# Patient Record
Sex: Male | Born: 1955
Health system: Southern US, Community
[De-identification: ages and names within clinical notes are randomized; demographics above are authoritative.]

---

## 2006-08-21 ENCOUNTER — Emergency Department (HOSPITAL_COMMUNITY): Admission: EM | Admit: 2006-08-21 | Discharge: 2006-08-21 | Payer: Self-pay | Admitting: Family Medicine

## 2006-08-23 ENCOUNTER — Emergency Department (HOSPITAL_COMMUNITY): Admission: EM | Admit: 2006-08-23 | Discharge: 2006-08-23 | Payer: Self-pay | Admitting: Emergency Medicine

## 2006-10-08 ENCOUNTER — Emergency Department (HOSPITAL_COMMUNITY): Admission: EM | Admit: 2006-10-08 | Discharge: 2006-10-08 | Payer: Self-pay | Admitting: Emergency Medicine

## 2013-07-27 ENCOUNTER — Emergency Department (HOSPITAL_COMMUNITY)
Admission: EM | Admit: 2013-07-27 | Discharge: 2013-07-27 | Disposition: A | Discharge status: Home / Self Care | Payer: Self-pay | Attending: Emergency Medicine | Admitting: Emergency Medicine

## 2013-07-27 ENCOUNTER — Encounter (HOSPITAL_COMMUNITY): Payer: Self-pay | Admitting: Emergency Medicine

## 2013-07-27 ENCOUNTER — Emergency Department (HOSPITAL_COMMUNITY): Payer: Self-pay

## 2013-07-27 DIAGNOSIS — Z79899 Other long term (current) drug therapy: Secondary | ICD-10-CM | POA: Insufficient documentation

## 2013-07-27 DIAGNOSIS — M109 Gout, unspecified: Secondary | ICD-10-CM

## 2013-07-27 LAB — CBC WITH DIFFERENTIAL/PLATELET
Basophils Absolute: 0 10*3/uL (ref 0.0–0.1)
Basophils Relative: 0 % (ref 0–1)
Eosinophils Absolute: 0.3 10*3/uL (ref 0.0–0.7)
Eosinophils Relative: 4 % (ref 0–5)
HCT: 40.2 % (ref 39.0–52.0)
MCH: 29.2 pg (ref 26.0–34.0)
MCHC: 34.6 g/dL (ref 30.0–36.0)
MCV: 84.5 fL (ref 78.0–100.0)
Monocytes Absolute: 0.5 10*3/uL (ref 0.1–1.0)
RDW: 14.1 % (ref 11.5–15.5)

## 2013-07-27 LAB — POCT I-STAT, CHEM 8
Calcium, Ion: 1.18 mmol/L (ref 1.12–1.23)
Chloride: 105 mEq/L (ref 96–112)
Glucose, Bld: 108 mg/dL — ABNORMAL HIGH (ref 70–99)
HCT: 42 % (ref 39.0–52.0)
Hemoglobin: 14.3 g/dL (ref 13.0–17.0)

## 2013-07-27 MED ORDER — PREDNISONE 20 MG PO TABS: 40.0000 mg | ORAL_TABLET | Freq: Every day | ORAL | Status: AC

## 2013-07-27 MED ORDER — PREDNISONE 20 MG PO TABS
40.0000 mg | ORAL_TABLET | Freq: Once | ORAL | Status: AC
  Administered 2013-07-27: 40 mg via ORAL
  Filled 2013-07-27: qty 2

## 2013-07-27 MED ORDER — HYDROCODONE-ACETAMINOPHEN 5-325 MG PO TABS: 1.0000 | ORAL_TABLET | Freq: Four times a day (QID) | ORAL | Status: DC | PRN

## 2013-07-27 MED ORDER — HYDROCODONE-ACETAMINOPHEN 5-325 MG PO TABS
1.0000 | ORAL_TABLET | Freq: Once | ORAL | Status: AC
  Administered 2013-07-27: 1 via ORAL
  Filled 2013-07-27: qty 1

## 2013-07-27 NOTE — ED Provider Notes (Signed)
  CSN: [id number]     Arrival date & time 07/27/13  0146 History     First MD Initiated Contact with Patient 07/27/13 0422     Chief Complaint  Patient presents with  . Hand Pain   (Consider location/radiation/quality/duration/timing/severity/associated sxs/prior Treatment) Patient is a 57 y.o. male presenting with hand pain.  Hand Pain    History reviewed. No pertinent past medical history. History reviewed. No pertinent past surgical history. No family history on file. History  Substance Use Topics  . Smoking status: Never Smoker   . Smokeless tobacco: Not on file  . Alcohol Use: Yes    Review of Systems  Allergies  Review of patient's allergies indicates no known allergies.  Home Medications   Current Outpatient Rx  Name  Route  Sig  Dispense  Refill  . HYDROcodone-acetaminophen (VICODIN) 5-500 MG per tablet   Oral   Take 2 tablets by mouth once.         Marland Kitchen ibuprofen (ADVIL,MOTRIN) 200 MG tablet   Oral   Take 400 mg by mouth once.         Marland Kitchen HYDROcodone-acetaminophen (NORCO/VICODIN) 5-325 MG per tablet   Oral   Take 1 tablet by mouth every 6 (six) hours as needed for pain.   30 tablet   0   . predniSONE (DELTASONE) 20 MG tablet   Oral   Take 2 tablets (40 mg total) by mouth daily.   10 tablet   0    BP 149/93  Pulse 92  Temp(Src) 98.9 F (37.2 C) (Oral)  Resp 14  SpO2 97% Physical Exam  ED Course   Procedures (including critical care time)  Labs Reviewed  URIC ACID - Abnormal; Notable for the following:    Uric Acid, Serum 9.8 (*)    All other components within normal limits  POCT I-STAT, CHEM 8 - Abnormal; Notable for the following:    Glucose, Bld 108 (*)    All other components within normal limits  CBC WITH DIFFERENTIAL   Dg Hand Complete Right  07/27/2013   *RADIOLOGY REPORT*  Clinical Data: Right hand pain for 2 days.  No injury.  RIGHT HAND - COMPLETE 3+ VIEW  Comparison: None.  Findings: Anatomic alignment bones of the right  hand.  There is no fracture identified.  No radiopaque foreign body.  Mild second MCP joint osteoarthritis is present.  Index finger predominant DIP joint osteoarthritis is present with large marginal osteophytes and overlying soft tissue swelling compatible with Heberden nodes.  IMPRESSION: No acute osseous abnormality.  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   Original Report Authenticated By: Andreas Newport, M.D.   1. Gout attack     MDM  Most likely gout  + uric acid   Arman Filter, NP 07/27/13 2015

## 2013-07-27 NOTE — ED Notes (Signed)
Pt comfortable with d/c and f/u instructions. Prescriptions x2. 

## 2013-07-27 NOTE — ED Notes (Signed)
PT. REPORTS PROGRESSING RIGHT HAND PAIN / FINGERS STIFFNESS FOR SEVERAL DAYS WORSE THIS EVENING , DENIE INJURY.

## 2013-07-28 NOTE — ED Provider Notes (Signed)
Medical screening examination/treatment/procedure(s) were performed by non-physician practitioner and as supervising physician I was immediately available for consultation/collaboration.  Lexine Jaspers, MD 07/28/13 0443 

## 2014-02-04 ENCOUNTER — Emergency Department (HOSPITAL_COMMUNITY)
Admission: EM | Admit: 2014-02-04 | Discharge: 2014-02-05 | Disposition: A | Discharge status: Home / Self Care | Payer: PRIVATE HEALTH INSURANCE | Attending: Emergency Medicine | Admitting: Emergency Medicine

## 2014-02-04 ENCOUNTER — Encounter (HOSPITAL_COMMUNITY): Payer: Self-pay | Admitting: Emergency Medicine

## 2014-02-04 DIAGNOSIS — F1020 Alcohol dependence, uncomplicated: Secondary | ICD-10-CM | POA: Insufficient documentation

## 2014-02-04 LAB — URINALYSIS, ROUTINE W REFLEX MICROSCOPIC
Bilirubin Urine: NEGATIVE
GLUCOSE, UA: NEGATIVE mg/dL
Hgb urine dipstick: NEGATIVE
KETONES UR: NEGATIVE mg/dL
LEUKOCYTES UA: NEGATIVE
NITRITE: NEGATIVE
PH: 7 (ref 5.0–8.0)
Protein, ur: NEGATIVE mg/dL
SPECIFIC GRAVITY, URINE: 1.018 (ref 1.005–1.030)
Urobilinogen, UA: 0.2 mg/dL (ref 0.0–1.0)

## 2014-02-04 LAB — CBC WITH DIFFERENTIAL/PLATELET
Basophils Absolute: 0 10*3/uL (ref 0.0–0.1)
Basophils Relative: 0 % (ref 0–1)
EOS ABS: 0.2 10*3/uL (ref 0.0–0.7)
EOS PCT: 4 % (ref 0–5)
HCT: 38.4 % — ABNORMAL LOW (ref 39.0–52.0)
HEMOGLOBIN: 12.8 g/dL — AB (ref 13.0–17.0)
LYMPHS ABS: 1 10*3/uL (ref 0.7–4.0)
LYMPHS PCT: 18 % (ref 12–46)
MCH: 28.1 pg (ref 26.0–34.0)
MCHC: 33.3 g/dL (ref 30.0–36.0)
MCV: 84.2 fL (ref 78.0–100.0)
MONOS PCT: 8 % (ref 3–12)
Monocytes Absolute: 0.4 10*3/uL (ref 0.1–1.0)
NEUTROS PCT: 69 % (ref 43–77)
Neutro Abs: 3.7 10*3/uL (ref 1.7–7.7)
PLATELETS: 210 10*3/uL (ref 150–400)
RBC: 4.56 MIL/uL (ref 4.22–5.81)
RDW: 14.3 % (ref 11.5–15.5)
WBC: 5.3 10*3/uL (ref 4.0–10.5)

## 2014-02-04 LAB — COMPREHENSIVE METABOLIC PANEL
ALK PHOS: 68 U/L (ref 39–117)
ALT: 18 U/L (ref 0–53)
AST: 25 U/L (ref 0–37)
Albumin: 3.7 g/dL (ref 3.5–5.2)
BUN: 13 mg/dL (ref 6–23)
CO2: 27 meq/L (ref 19–32)
Calcium: 8.7 mg/dL (ref 8.4–10.5)
Chloride: 101 mEq/L (ref 96–112)
Creatinine, Ser: 1.02 mg/dL (ref 0.50–1.35)
GFR, EST NON AFRICAN AMERICAN: 80 mL/min — AB (ref >90)
GLUCOSE: 105 mg/dL — AB (ref 70–99)
POTASSIUM: 4.5 meq/L (ref 3.7–5.3)
SODIUM: 138 meq/L (ref 137–147)
TOTAL PROTEIN: 7.8 g/dL (ref 6.0–8.3)
Total Bilirubin: 0.4 mg/dL (ref 0.3–1.2)

## 2014-02-04 LAB — RAPID URINE DRUG SCREEN, HOSP PERFORMED
Amphetamines: NOT DETECTED
Barbiturates: NOT DETECTED
Benzodiazepines: NOT DETECTED
Cocaine: NOT DETECTED
OPIATES: NOT DETECTED
Tetrahydrocannabinol: NOT DETECTED

## 2014-02-04 LAB — ETHANOL

## 2014-02-04 MED ORDER — LORAZEPAM 1 MG PO TABS: 1.0000 mg | ORAL_TABLET | Freq: Three times a day (TID) | ORAL | Status: DC | PRN

## 2014-02-04 MED ORDER — IBUPROFEN 200 MG PO TABS: 600.0000 mg | ORAL_TABLET | Freq: Three times a day (TID) | ORAL | Status: DC | PRN

## 2014-02-04 MED ORDER — ONDANSETRON HCL 4 MG PO TABS
4.0000 mg | ORAL_TABLET | Freq: Three times a day (TID) | ORAL | Status: DC | PRN
Start: 2014-02-04 — End: 2014-02-05

## 2014-02-04 MED ORDER — ACETAMINOPHEN 325 MG PO TABS
650.0000 mg | ORAL_TABLET | ORAL | Status: DC | PRN
Start: 2014-02-04 — End: 2014-02-05

## 2014-02-04 MED ORDER — ALUM & MAG HYDROXIDE-SIMETH 200-200-20 MG/5ML PO SUSP
30.0000 mL | ORAL | Status: DC | PRN
Start: 2014-02-04 — End: 2014-02-05

## 2014-02-04 MED ORDER — NICOTINE 21 MG/24HR TD PT24: 21.0000 mg | MEDICATED_PATCH | Freq: Every day | TRANSDERMAL | Status: DC

## 2014-02-04 MED ORDER — ZOLPIDEM TARTRATE 5 MG PO TABS: 5.0000 mg | ORAL_TABLET | Freq: Every evening | ORAL | Status: DC | PRN

## 2014-02-04 NOTE — ED Provider Notes (Signed)
CSN: [id number]     Arrival date & time 02/04/14  1225 History   First MD Initiated Contact with Patient 02/04/14 1240     Chief Complaint  Patient presents with  . Medical Clearance   (Consider location/radiation/quality/duration/timing/severity/associated sxs/prior Treatment) HPI Comments: Patient is a 58 year old male with history of chronic alcoholism. He presents today requesting inpatient treatment. He states that he drinks 1 pint of hard liquor per day along with with unspecified quantities of beer and wine. He last drank yesterday evening. He denies any other complaints.  The history is provided by the patient.    History reviewed. No pertinent past medical history. History reviewed. No pertinent past surgical history. History reviewed. No pertinent family history. History  Substance Use Topics  . Smoking status: Never Smoker   . Smokeless tobacco: Not on file  . Alcohol Use: Yes    Review of Systems  All other systems reviewed and are negative.    Allergies  Review of patient's allergies indicates no known allergies.  Home Medications   Current Outpatient Rx  Name  Route  Sig  Dispense  Refill  . HYDROcodone-acetaminophen (NORCO/VICODIN) 5-325 MG per tablet   Oral   Take 1 tablet by mouth every 6 (six) hours as needed for pain.   30 tablet   0   . HYDROcodone-acetaminophen (VICODIN) 5-500 MG per tablet   Oral   Take 2 tablets by mouth once.         Marland Kitchen. ibuprofen (ADVIL,MOTRIN) 200 MG tablet   Oral   Take 400 mg by mouth once.          BP 153/88  Pulse 85  Temp(Src) 98.1 F (36.7 C) (Oral)  Resp 18  SpO2 100% Physical Exam  Nursing note and vitals reviewed. Constitutional: He is oriented to person, place, and time. He appears well-developed and well-nourished. No distress.  HENT:  Head: Normocephalic and atraumatic.  Mouth/Throat: Oropharynx is clear and moist.  Neck: Normal range of motion. Neck supple.  Cardiovascular: Normal rate, regular  rhythm and normal heart sounds.   No murmur heard. Pulmonary/Chest: Effort normal and breath sounds normal. No respiratory distress. He has no wheezes.  Abdominal: Soft. Bowel sounds are normal. He exhibits no distension. There is no tenderness.  Musculoskeletal: Normal range of motion. He exhibits no edema.  Neurological: He is alert and oriented to person, place, and time.  Skin: Skin is warm and dry. He is not diaphoretic.    ED Course  Procedures (including critical care time) Labs Review Labs Reviewed  CBC WITH DIFFERENTIAL  COMPREHENSIVE METABOLIC PANEL  URINE RAPID DRUG SCREEN (HOSP PERFORMED)  ETHANOL  URINALYSIS, ROUTINE W REFLEX MICROSCOPIC   Imaging Review No results found.    MDM  No diagnosis found. Patient is a 58 year old male who presents requesting alcohol treatment. Laboratory studies are unremarkable and he appears to be medically cleared. He will be moved to the psych holding unit awaiting consultation with [facility]. He is not homicidal or suicidal and is voluntary at this time.    Geoffery Lyonsouglas Carmello Cabiness, MD 02/04/14 [phone]

## 2014-02-04 NOTE — ED Notes (Signed)
Patient sat on the chair in his room at the beginning of this shift. He appeared very irritable. He reported feeling uncomfortable because the temperature in his room was too cold. Also requested for his shoes that was already locked up in the locker.  Writer adjusted room temperature. He denied SI/Hi and denied hallucinations. Q 15 minute check continues as ordered to maintain safety.

## 2014-02-04 NOTE — ED Notes (Signed)
Pt here for detox from alcohol.  Pt denies SI/HI.  Pt states had a gap in drinking and started back again recently.  Wants inpatient

## 2014-02-04 NOTE — Progress Notes (Signed)
[facility] provided pt with a list of primary care resources and [facility] information. Patient stated that he was pending insurance on 3/1st.

## 2014-02-04 NOTE — ED Notes (Addendum)
Patient states he is here on the recommendation of treatment centers to "detox" from etoh.  States he drank " 1 pint" last night.  Denies other drug use. Also reports upcoming DUI court date.

## 2014-02-04 NOTE — Progress Notes (Signed)
[facility] who stated that they did have male detox beds but they were short a nurse and probably wouldn't have a disposition for us until morning.  Spoke with Joni ReiningNicole at [facility] who stated that  They also had male detox beds available and to fax over information with referral form

## 2014-02-04 NOTE — ED Notes (Signed)
Pt found in hallway.  MD notified.

## 2014-02-04 NOTE — ED Notes (Signed)
Patient in his room. Sitting on his chair and watching TV. Mood and affect continues to be blunted and irritable.He denied SI/HI and denied hallucinations. He refused HS medication. Writer encouraged and supported patient. Q 15 minute check continues as ordered to maintain safety.

## 2014-02-04 NOTE — ED Notes (Signed)
Patient's v/s elevated earlier. MD notified, he encouraged staff to recheck it v/s manually. Writer manually rechecked v/s, it was 144/86. Notified MD again. He said that's  Alright. No treatment at this time per physician.

## 2014-02-04 NOTE — ED Notes (Signed)
MD at bedside. 

## 2014-02-04 NOTE — Progress Notes (Signed)
   CARE MANAGEMENT ED NOTE 02/04/2014  Patient:  Earl LagosJOYNER,Kenyata   Account Number:  [id number]  Date Initiated:  02/04/2014  Documentation initiated by:  Radford PaxFERRERO,Marcus Groll  Subjective/Objective Assessment:   Patient presents to Ed with wanting help with alcohol abuse.     Subjective/Objective Assessment Detail:   Patient without significant pmhx     Action/Plan:   Action/Plan Detail:   Anticipated DC Date:       Status Recommendation to Physician:   Result of Recommendation:    Other ED Services  Consult Working Plan    DC Planning Services  Other  PCP issues    Choice offered to / List presented to:            Status of service:  Completed, signed off  ED Comments:   ED Comments Detail:  Cristy FriedlanderStacy Wright  Signed  Progress Notes Service date: 02/04/2014 3:54 PM [facility] provided pt with a list of primary care resources and [facility] information. Patient stated that he was pending insurance on 3/1st

## 2014-02-04 NOTE — ED Notes (Signed)
Went to find patient.  Pt not in location.  Notified staff to be on lookout.

## 2014-02-04 NOTE — ED Notes (Signed)
Pt in bathroom undressing.

## 2014-02-04 NOTE — BH Assessment (Signed)
Assessment Note  Robert Johns is an 58 y.o. male. Patient presents seeking help with detox and rehabilitation services. Patient states that he has been attempting to detox on his own to avoid coming to the ED and accruing bills that he can not afford to pay. He states that he is still in litigation over a  DUI he received 2 years ago. He states that he felt his drinking getting out of hand and attempted wind it back in before hit started affecting his job but was unable to do so. His has been unable to get his life back together. He has been unemployed for the past year and trying his best not to become homeless. Theses issues are causing him to have increased anxiety,stress and decreased focus. Patient states that he tried to us the tools he obtained 8 years ago while going through treatment with [facility], he even attempted to contact facilities on his own to get some sort of help with treatment but everything has failed for him and he feels that this is his last option.   Patient states that his longest period of sobriety has been 1 month. His has a court date on 02/15/14 for a DUI and 02/16/14 for an open container. States that he drinks approximately a pint a day. He last drank yesterday. Denies SI, HI, AVH.      Axis I: Alcohol Dependence Axis II: Deferred Axis III: History reviewed. No pertinent past medical history. Axis IV: economic problems, housing problems, occupational problems, other psychosocial or environmental problems, problems related to legal system/crime, problems related to social environment and problems with primary support group Axis V: 40  Past Medical History: History reviewed. No pertinent past medical history.  History reviewed. No pertinent past surgical history.  Family History: History reviewed. No pertinent family history.  Social History:  reports that he has never smoked. He does not have any smokeless tobacco history on file. He reports that he  drinks alcohol. He reports that he does not use illicit drugs.  Additional Social History:  Alcohol / Drug Use Pain Medications: Na Prescriptions: Na Over the Counter: NA History of alcohol / drug use?: Yes Longest period of sobriety (when/how long): 1 month Negative Consequences of Use: Financial;Legal;Work / School Withdrawal Symptoms: Patient aware of relationship between substance abuse and physical/medical complications Substance #1 Name of Substance 1: Beer, Wine , Liquor 1 - Age of First Use: 16 1 - Amount (size/oz): 1 pint 1 - Frequency:  Daily 1 - Duration: 1 year 1 - Last Use / Amount: last night/ 1 pint  CIWA: CIWA-Ar BP: 153/88 mmHg Pulse Rate: 85 COWS:    Allergies: No Known Allergies  Home Medications:  (Not in a hospital admission)  OB/GYN Status:  No LMP for male patient.  General Assessment Data Location of Assessment: [facility] Assessment Services Is this a Tele or Face-to-Face Assessment?: Face-to-Face Is this an Initial Assessment or a Re-assessment for this encounter?: Initial Assessment Living Arrangements: Non-relatives/Friends Can pt return to current living arrangement?: Yes Admission Status: Voluntary Is patient capable of signing voluntary admission?: Yes Transfer from: Home Referral Source: MD  Medical Screening Exam [facility] Walk-in ONLY) Medical Exam completed: Yes  [facility] Living Arrangements: Non-relatives/Friends Name of Psychiatrist: one  Name of Therapist: None  Education Status Is patient currently in school?: No Current Grade: Na Highest grade of school patient has completed: 12th [place] [facility] [employer] [employer] Contact person: Teacher, English as a foreign languageylester Petrucci/ cousin  Risk to self  Suicidal Ideation: No Suicidal Intent: No Is patient at risk for suicide?: No Suicidal Plan?: No Access to Means: No Specify Access to Suicidal Means: Na What has been your use of drugs/alcohol within the last 12 months?: Daily Previous  Attempts/Gestures: No How many times?:  (Na) Other Self Harm Risks: None Triggers for Past Attempts: None known Intentional Self Injurious Behavior: None Family Suicide History: No Recent stressful life event(s): Job Loss;Financial Problems Persecutory voices/beliefs?: No Depression: No Depression Symptoms:  (Attentive, Anxious) Substance abuse history and/or treatment for substance abuse?: Yes Suicide prevention information given to non-admitted patients: Not applicable  Risk to Others Homicidal Ideation: No Thoughts of Harm to Others: No Current Homicidal Intent: No Current Homicidal Plan: No Access to Homicidal Means: No Identified Victim: Na History of harm to others?: No Assessment of Violence: None Noted Violent Behavior Description: a Does patient have access to weapons?: No Criminal Charges Pending?: Yes Describe Pending Criminal Charges: DUI/ Open container Does patient have a court date: Yes Court Date:  (02/15/14 & 02/16/14)  Psychosis Hallucinations: None noted Delusions: None noted  Mental Status Report Appear/Hygiene:  (WNL) Eye Contact: Good Motor Activity: Freedom of movement;Unremarkable Speech: Logical/coherent Level of Consciousness: Alert Mood: Anxious;Preoccupied Affect: Appropriate to circumstance;Anxious Anxiety Level: Minimal Thought Processes: Coherent;Relevant Judgement: Unimpaired Orientation: Person;Place;Time;Situation Obsessive Compulsive Thoughts/Behaviors: None  Cognitive Functioning Concentration: Normal Memory: Recent Intact;Remote Intact IQ: Average Insight: Good Impulse Control: Good Appetite: Fair Weight Loss:  (None noted) Weight Gain:  (None  noted) Sleep: No Change Total Hours of Sleep:  (7 hours) Vegetative Symptoms: None  ADLScreening [facility] Assessment Services) Patient's cognitive ability adequate to safely complete daily activities?: Yes Patient able to express need for assistance with ADLs?: Yes Independently  performs ADLs?: Yes (appropriate for developmental age)  Prior Inpatient Therapy Prior Inpatient Therapy: Yes Prior Therapy Dates: 8 years ago Prior Therapy Facilty/Provider(s): [facility]/ [facility] Reason for Treatment: SA  Prior Outpatient Therapy Prior Outpatient Therapy: No Prior Therapy Dates: Na Prior Therapy Facilty/Provider(s): Na Reason for Treatment: Na  ADL Screening (condition at time of admission) Patient's cognitive ability adequate to safely complete daily activities?: Yes Is the patient deaf or have difficulty hearing?: No Does the patient have difficulty seeing, even when wearing glasses/contacts?: No Does the patient have difficulty concentrating, remembering, or making decisions?: No Patient able to express need for assistance with ADLs?: Yes Does the patient have difficulty dressing or bathing?: No Independently performs ADLs?: Yes (appropriate for developmental age) Does the patient have difficulty walking or climbing stairs?: No Weakness of Legs: None Weakness of Arms/Hands: None  Home Assistive Devices/Equipment Home Assistive Devices/Equipment: None  Therapy Consults (therapy consults require a physician order) PT Evaluation Needed: No OT Evalulation Needed: No SLP Evaluation Needed: No Abuse/Neglect Assessment (Assessment to be complete while patient is alone) Physical Abuse: Denies Verbal Abuse: Denies Sexual Abuse: Denies Exploitation of patient/patient's resources: Denies Self-Neglect: Denies Values / Beliefs Cultural Requests During Hospitalization: None Spiritual Requests During Hospitalization: None Consults Spiritual Care Consult Needed: No Social Work Consult Needed: No Merchant navy officer (For Healthcare) Advance Directive: Patient does not have advance directive;Patient would not like information Pre-existing out of facility DNR order (yellow form or pink MOST form): No    Additional Information 1:1 In Past 12 Months?: No CIRT  Risk: No Elopement Risk: No Does patient have medical clearance?: Yes     Disposition:  Disposition Initial Assessment Completed for this Encounter: Yes Disposition of Patient: Other dispositions;Referred to Other disposition(s): Referred to outside facility (  [facility]) Patient referred to: [facility]  On Site Evaluation by:   Reviewed with Physician:    Rudi Coco 02/04/2014 4:54 PM

## 2014-02-05 ENCOUNTER — Inpatient Hospital Stay (HOSPITAL_COMMUNITY): Admission: AD | Admit: 2014-02-05 | Payer: Self-pay | Source: Intra-hospital | Admitting: Psychiatry

## 2014-02-05 DIAGNOSIS — F1020 Alcohol dependence, uncomplicated: Secondary | ICD-10-CM

## 2014-02-05 MED ORDER — HYDROXYZINE HCL 25 MG PO TABS: 25.0000 mg | ORAL_TABLET | Freq: Four times a day (QID) | ORAL | Status: DC | PRN

## 2014-02-05 MED ORDER — CHLORDIAZEPOXIDE HCL 25 MG PO CAPS: 25.0000 mg | ORAL_CAPSULE | Freq: Four times a day (QID) | ORAL | Status: DC

## 2014-02-05 MED ORDER — CHLORDIAZEPOXIDE HCL 25 MG PO CAPS: 25.0000 mg | ORAL_CAPSULE | Freq: Every day | ORAL | Status: DC

## 2014-02-05 MED ORDER — ADULT MULTIVITAMIN W/MINERALS CH: 1.0000 | ORAL_TABLET | Freq: Every day | ORAL | Status: DC

## 2014-02-05 MED ORDER — THIAMINE HCL 100 MG/ML IJ SOLN: 100.0000 mg | Freq: Once | INTRAMUSCULAR | Status: DC

## 2014-02-05 MED ORDER — CHLORDIAZEPOXIDE HCL 25 MG PO CAPS: 50.0000 mg | ORAL_CAPSULE | Freq: Once | ORAL | Status: DC

## 2014-02-05 MED ORDER — VITAMIN B-1 100 MG PO TABS: 100.0000 mg | ORAL_TABLET | Freq: Every day | ORAL | Status: DC

## 2014-02-05 MED ORDER — CHLORDIAZEPOXIDE HCL 25 MG PO CAPS: 25.0000 mg | ORAL_CAPSULE | Freq: Three times a day (TID) | ORAL | Status: DC

## 2014-02-05 MED ORDER — CHLORDIAZEPOXIDE HCL 25 MG PO CAPS: 25.0000 mg | ORAL_CAPSULE | Freq: Four times a day (QID) | ORAL | Status: DC | PRN

## 2014-02-05 MED ORDER — ONDANSETRON 4 MG PO TBDP: 4.0000 mg | ORAL_TABLET | Freq: Four times a day (QID) | ORAL | Status: DC | PRN

## 2014-02-05 MED ORDER — LOPERAMIDE HCL 2 MG PO CAPS: 2.0000 mg | ORAL_CAPSULE | ORAL | Status: DC | PRN

## 2014-02-05 MED ORDER — CHLORDIAZEPOXIDE HCL 25 MG PO CAPS: 25.0000 mg | ORAL_CAPSULE | ORAL | Status: DC

## 2014-02-05 NOTE — ED Notes (Signed)
[facility] notified of transportation need 

## 2014-02-05 NOTE — ED Notes (Signed)
Resting comfortably with eyes closed

## 2014-02-05 NOTE — ED Notes (Signed)
[facility] Accepted to room [facility]1. Will [facility].

## 2014-02-05 NOTE — ED Notes (Signed)
Pt discharged to home.  Discharge instructions both verbal and written to pt with verbalization of understanding.  Discharge instructions to include follow up care.  All belongings in pts possession and signed for.  Dr. Lolly MustacheArfeen was able to talk to pt prior to discharge answering any questions or concerns.   Denies HI/SI, auditory or visual hallucinations on discharge.  Pt ready for discharge, with no further questions.  Pt escorted to lobby for discharge.

## 2014-02-05 NOTE — ED Notes (Signed)
Patient is resting comfortably. 

## 2014-02-05 NOTE — BH Assessment (Signed)
Alberteen SamFran Hobson, NP accepted Pt to the service of Dr. Beverly MilchGlenn Jennings, room 304-1. Notified RN of acceptance.  Harlin RainFord Ellis Ria CommentWarrick Jr, LPC, [facility] Triage Specialist

## 2014-02-05 NOTE — ED Notes (Signed)
Patient states last drink was 2 days ago and amount he drinks varies. Not specific about quantity.

## 2014-02-05 NOTE — ED Notes (Signed)
Notified by Dr.Arfeen that patient would be discharged home with resource materials for [facility] and [facility]. Notified Engineer, petroleumTroy [facility]

## 2014-02-05 NOTE — [facility] Suicide Risk Assessment (Cosign Needed)
Suicide Risk Assessment  Discharge Assessment     Demographic Factors:  Male, Low socioeconomic status and Unemployed  Total Time spent with patient: 15 minutes  Psychiatric Specialty Exam:     Blood pressure 135/77, pulse 74, temperature 97.4 F (36.3 C), temperature source Oral, resp. rate 19, SpO2 99.00%.There is no height or weight on file to calculate BMI.  General Appearance: Casual and Fairly Groomed  Patent attorneyye Contact::  Fair  Speech:  Clear and Coherent and Normal Rate  Volume:  Normal  Mood:  Depressed  Affect:  Depressed  Thought Process:  Coherent and Goal Directed  Orientation:  Full (Time, Place, and Person)  Thought Content:  NA  Suicidal Thoughts:  No  Homicidal Thoughts:  No  Memory:  Immediate;   Good Recent;   Good Remote;   Good  Judgement:  Fair  Insight:  Good  Psychomotor Activity:  Normal  Concentration:  Good  Recall:  NA  Fund of Knowledge:English is primary language  Language: Good  Akathisia:  NA  Handed:  Right  AIMS (if indicated):     Assets:  Desire for Improvement  Sleep:       Musculoskeletal: Strength & Muscle Tone: within normal limits Gait & Station: normal Patient leans: N/A   Mental Status Per Nursing Assessment::   On Admission:     Current Mental Status by Physician: NA  Loss Factors: Financial problems/change in socioeconomic status and lost job  Historical Factors: NA  Risk Reduction Factors:   Sense of responsibility to family, Positive coping skills or problem solving skills and Positive with his plan to clean himself and secure another job  Continued Clinical Symptoms:  Alcohol/Substance Abuse/Dependencies  Cognitive Features That Contribute To Risk:  Polarized thinking    Suicide Risk:  Minimal: No identifiable suicidal ideation.  Patients presenting with no risk factors but with morbid ruminations; may be classified as minimal risk based on the severity of the depressive symptoms  Discharge Diagnoses:    AXIS I:  Alcohol depend AXIS II:  Deferred AXIS III:  History reviewed. No pertinent past medical history. AXIS IV:  other psychosocial or environmental problems, problems related to legal system/crime, problems related to social environment and problems with primary support group AXIS V:  61-70 mild symptoms  Plan Of Care/Follow-up recommendations:  Activity:  as tolerated Diet:  regula  Is patient on multiple antipsychotic therapies at discharge:  No   Has Patient had three or more failed trials of antipsychotic monotherapy by history:  No  Recommended Plan for Multiple Antipsychotic Therapies: NA    Analuisa Tudor, C   PMHNP-BC 02/05/2014, 1:15 PM

## 2014-02-05 NOTE — Consult Note (Addendum)
[facility] Consult   Reason for Consult:  Alcohol Dependence Referring Physician:  EDP Robert Johns is an 58 y.o. male. Total Time spent with patient: 45 minutes  Assessment: AXIS I:  Alcohol dependenc,  AXIS II:  Deferred AXIS III:  History reviewed. No pertinent past medical history. AXIS IV:  other psychosocial or environmental problems and problems related to social environment AXIS V:  61-70 mild symptoms  Plan:  No evidence of imminent risk to self or others at present.    Subjective:   Robert Johns is a 58 y.o. male patient was evaluated for Alcohol dependence and withdrawal symptoms.  HPI:  Robert Johns is an 58 y.o. male. Patient presents seeking help with detox and rehabilitation services. He has been unable to get his life back together and  has been unemployed for the past year and trying his best not to become homeless. These issues are causing him to have increased anxiety,stress and decreased focus.  He is seeking treatment for his drinking so he can go back to work and get his life together.  Patient states his mood is depressed but that he has not been officially diagnosed with depression.  Patient also states he does not have out patient provider.  Patient declines treatment for his mood until he completes alcohol rehabilitation.  Patient believes his alcohol problem is related to his mood but will like to complete rehabilitation .treatment first. Patient states that his longest period of sobriety has been 1 month. His has a court date on 02/15/14 for a DUI and 02/16/14 for an open container. States that he drinks approximately a pint a day. He last drank yesterday. Denies SI, HI, AVH.  We have determined that patient is not a danger to himself and he promises to follow up with [facility], or [facility] for inpatient alcohol rehabilitation.  HPI Elements:   Location:  Alcohol problem. Quality:  It is becoming a problem, he lost his job and is about to lose his  hiouse. Severity:  Severe, lost his job and may lose his house, cannot get his life back due to alcoholism. Context:  Long time chronic drinking problems..  Past Psychiatric History: History reviewed. No pertinent past medical history.  reports that he has never smoked. He does not have any smokeless tobacco history on file. He reports that he drinks alcohol. He reports that he does not use illicit drugs. History reviewed. No pertinent family history. Family History Substance Abuse: Yes, Describe: (Daily) Family Supports: No Living Arrangements: Non-relatives/Friends Can pt return to current living arrangement?: Yes Abuse/Neglect [facility] Physical Abuse: Denies Verbal Abuse: Denies Sexual Abuse: Denies Allergies:  No Known Allergies  ACT Assessment Complete:  Yes:    Educational Status    Risk to Self: Risk to self Suicidal Ideation: No Suicidal Intent: No Is patient at risk for suicide?: No Suicidal Plan?: No Access to Means: No Specify Access to Suicidal Means: Na What has been your use of drugs/alcohol within the last 12 months?: Daily Previous Attempts/Gestures: No How many times?:  (Na) Other Self Harm Risks: None Triggers for Past Attempts: None known Intentional Self Injurious Behavior: None Family Suicide History: No Recent stressful life event(s): Job Loss;Financial Problems Persecutory voices/beliefs?: No Depression: No Depression Symptoms:  (Attentive, Anxious) Substance abuse history and/or treatment for substance abuse?: Yes (Pt reports ETOH abuse) Suicide prevention information given to non-admitted patients: Not applicable  Risk to Others: Risk to Others Homicidal Ideation: No Thoughts of Harm to Others: No Current Homicidal  Intent: No Current Homicidal Plan: No Access to Homicidal Means: No Identified Victim: Na History of harm to others?: No Assessment of Violence: None Noted Violent Behavior Description: a Does patient have access to weapons?:  No Criminal Charges Pending?: Yes Describe Pending Criminal Charges: DUI/ Open container Does patient have a court date: Yes Court Date:  (02/15/14 & 02/16/14)  Abuse: Abuse/Neglect Assessment (Assessment to be complete while patient is alone) Physical Abuse: Denies Verbal Abuse: Denies Sexual Abuse: Denies Exploitation of patient/patient's resources: Denies Self-Neglect: Denies  Prior Inpatient Therapy: Prior Inpatient Therapy Prior Inpatient Therapy: Yes Prior Therapy Dates: 8 years ago Prior Therapy Facilty/Provider(s): [facility]/ [facility] Reason for Treatment: SA  Prior Outpatient Therapy: Prior Outpatient Therapy Prior Outpatient Therapy: No Prior Therapy Dates: Na Prior Therapy Facilty/Provider(s): Na Reason for Treatment: Na  Additional Information: Additional Information 1:1 In Past 12 Months?: No CIRT Risk: No Elopement Risk: No Does patient have medical clearance?: Yes                  Objective: Blood pressure 135/77, pulse 74, temperature 97.4 F (36.3 C), temperature source Oral, resp. rate 19, SpO2 99.00%.There is no height or weight on file to calculate BMI. Results for orders placed during the hospital encounter of 02/04/14 (from the past 72 hour(s))  CBC WITH DIFFERENTIAL     Status: Abnormal   Collection Time    02/04/14  1:32 PM      Result Value Range   WBC 5.3  4.0 - 10.5 K/uL   RBC 4.56  4.22 - 5.81 MIL/uL   Hemoglobin 12.8 (*) 13.0 - 17.0 g/dL   HCT 38.4 (*) 39.0 - 52.0 %   MCV 84.2  78.0 - 100.0 fL   MCH 28.1  26.0 - 34.0 pg   MCHC 33.3  30.0 - 36.0 g/dL   RDW 14.3  11.5 - 15.5 %   Platelets 210  150 - 400 K/uL   Neutrophils Relative % 69  43 - 77 %   Neutro Abs 3.7  1.7 - 7.7 K/uL   Lymphocytes Relative 18  12 - 46 %   Lymphs Abs 1.0  0.7 - 4.0 K/uL   Monocytes Relative 8  3 - 12 %   Monocytes Absolute 0.4  0.1 - 1.0 K/uL   Eosinophils Relative 4  0 - 5 %   Eosinophils Absolute 0.2  0.0 - 0.7 K/uL   Basophils Relative 0   0 - 1 %   Basophils Absolute 0.0  0.0 - 0.1 K/uL  COMPREHENSIVE METABOLIC PANEL     Status: Abnormal   Collection Time    02/04/14  1:32 PM      Result Value Range   Sodium 138  137 - 147 mEq/L   Potassium 4.5  3.7 - 5.3 mEq/L   Chloride 101  96 - 112 mEq/L   CO2 27  19 - 32 mEq/L   Glucose, Bld 105 (*) 70 - 99 mg/dL   BUN 13  6 - 23 mg/dL   Creatinine, Ser 1.02  0.50 - 1.35 mg/dL   Calcium 8.7  8.4 - 10.5 mg/dL   Total Protein 7.8  6.0 - 8.3 g/dL   Albumin 3.7  3.5 - 5.2 g/dL   AST 25  0 - 37 U/L   ALT 18  0 - 53 U/L   Alkaline Phosphatase 68  39 - 117 U/L   Total Bilirubin 0.4  0.3 - 1.2 mg/dL   GFR calc  non Af Amer 80 (*) >90 mL/min   GFR calc Af Amer >90  >90 mL/min   Comment: (NOTE)     The eGFR has been calculated using the CKD EPI equation.     This calculation has not been validated in all clinical situations.     eGFR's persistently <90 mL/min signify possible Chronic Kidney     Disease.  ETHANOL     Status: None   Collection Time    02/04/14  1:32 PM      Result Value Range   Alcohol, Ethyl (B) <11  0 - 11 mg/dL   Comment:            LOWEST DETECTABLE LIMIT FOR     SERUM ALCOHOL IS 11 mg/dL     FOR MEDICAL PURPOSES ONLY  URINE RAPID DRUG SCREEN (HOSP PERFORMED)     Status: None   Collection Time    02/04/14  2:37 PM      Result Value Range   Opiates NONE DETECTED  NONE DETECTED   Cocaine NONE DETECTED  NONE DETECTED   Benzodiazepines NONE DETECTED  NONE DETECTED   Amphetamines NONE DETECTED  NONE DETECTED   Tetrahydrocannabinol NONE DETECTED  NONE DETECTED   Barbiturates NONE DETECTED  NONE DETECTED   Comment:            DRUG SCREEN FOR MEDICAL PURPOSES     ONLY.  IF CONFIRMATION IS NEEDED     FOR ANY PURPOSE, NOTIFY LAB     WITHIN 5 DAYS.                LOWEST DETECTABLE LIMITS     FOR URINE DRUG SCREEN     Drug Class       Cutoff (ng/mL)     Amphetamine      1000     Barbiturate      200     Benzodiazepine   076     Tricyclics       808      Opiates          300     Cocaine          300     THC              50  URINALYSIS, ROUTINE W REFLEX MICROSCOPIC     Status: None   Collection Time    02/04/14  2:37 PM      Result Value Range   Color, Urine YELLOW  YELLOW   APPearance CLEAR  CLEAR   Specific Gravity, Urine 1.018  1.005 - 1.030   pH 7.0  5.0 - 8.0   Glucose, UA NEGATIVE  NEGATIVE mg/dL   Hgb urine dipstick NEGATIVE  NEGATIVE   Bilirubin Urine NEGATIVE  NEGATIVE   Ketones, ur NEGATIVE  NEGATIVE mg/dL   Protein, ur NEGATIVE  NEGATIVE mg/dL   Urobilinogen, UA 0.2  0.0 - 1.0 mg/dL   Nitrite NEGATIVE  NEGATIVE   Leukocytes, UA NEGATIVE  NEGATIVE   Comment: MICROSCOPIC NOT DONE ON URINES WITH NEGATIVE PROTEIN, BLOOD, LEUKOCYTES, NITRITE, OR GLUCOSE <1000 mg/dL.   Labs are reviewed and are pertinent for Unremarkable.  Current Facility-Administered Medications  Medication Dose Route Frequency Provider Last Rate Last Dose  . acetaminophen (TYLENOL) tablet 650 mg  650 mg Oral Q4H PRN Leota Jacobsen, MD      . alum & mag hydroxide-simeth (MAALOX/MYLANTA) 200-200-20 MG/5ML suspension 30 mL  30 mL Oral PRN  Leota Jacobsen, MD      . chlordiazePOXIDE (LIBRIUM) capsule 25 mg  25 mg Oral Q6H PRN Lurena Nida, NP      . chlordiazePOXIDE (LIBRIUM) capsule 25 mg  25 mg Oral QID Lurena Nida, NP       Followed by  . [START ON 02/06/2014] chlordiazePOXIDE (LIBRIUM) capsule 25 mg  25 mg Oral TID Lurena Nida, NP       Followed by  . [START ON 02/07/2014] chlordiazePOXIDE (LIBRIUM) capsule 25 mg  25 mg Oral BH-qamhs Lurena Nida, NP       Followed by  . [START ON 02/08/2014] chlordiazePOXIDE (LIBRIUM) capsule 25 mg  25 mg Oral Daily Lurena Nida, NP      . chlordiazePOXIDE (LIBRIUM) capsule 50 mg  50 mg Oral Once Lurena Nida, NP      . hydrOXYzine (ATARAX/VISTARIL) tablet 25 mg  25 mg Oral Q6H PRN Lurena Nida, NP      . ibuprofen (ADVIL,MOTRIN) tablet 600 mg  600 mg Oral Q8H PRN Leota Jacobsen, MD      . loperamide (IMODIUM)  capsule 2-4 mg  2-4 mg Oral PRN Lurena Nida, NP      . multivitamin with minerals tablet 1 tablet  1 tablet Oral Daily Lurena Nida, NP      . nicotine (NICODERM CQ - dosed in mg/24 hours) patch 21 mg  21 mg Transdermal Daily Leota Jacobsen, MD      . ondansetron (ZOFRAN-ODT) disintegrating tablet 4 mg  4 mg Oral Q6H PRN Lurena Nida, NP      . thiamine (B-1) injection 100 mg  100 mg Intramuscular Once Lurena Nida, NP      . Derrill Memo ON 02/06/2014] thiamine (VITAMIN B-1) tablet 100 mg  100 mg Oral Daily Lurena Nida, NP       No current outpatient prescriptions on file.   Physical Examination:  Review of physical examination data performed by the EDP Yesterday is wnl.  No changes or abnormal findings noted.  Psychiatric Specialty Exam:     Blood pressure 135/77, pulse 74, temperature 97.4 F (36.3 C), temperature source Oral, resp. rate 19, SpO2 99.00%.There is no height or weight on file to calculate BMI.  General Appearance: Casual and Fairly Groomed  Engineer, water::  Fair  Speech:  Clear and Coherent and Normal Rate  Volume:  Normal  Mood:  Depressed, Hopeless and Worthless  Affect:  Congruent, Depressed and Flat  Thought Process:  Coherent, Goal Directed and Intact  Orientation:  Full (Time, Place, and Person)  Thought Content:  NA  Suicidal Thoughts:  No  Homicidal Thoughts:  No  Memory:  Immediate;   Good Recent;   Good Remote;   Good  Judgement:  Fair  Insight:  Good  Psychomotor Activity:  Normal  Concentration:  Good  Recall:  NA  Fund of Knowledge:NA  Language: English is primary language, able to complete a sentence  Akathisia:  NA  Handed:  Right  AIMS (if indicated):     Assets:  Desire for Improvement  Sleep:      Musculoskeletal: Strength & Muscle Tone: within normal limits Gait & Station: normal Patient leans: N/A  Treatment Plan Summary:  Consult and face to face interview with Dr Adele Schilder Patient is for discharge to follow up with rehabilitation  facilities for care We have discussed with patient need to stop drinking and seek treatment for depression  Patient verbalizes understanding.   To be discharged home now with follow up information  Charmaine Downs, Loletha Grayer   PMHNP-BC 02/05/2014 12:43 PM  I have personally seen the patient and agreed with the findings and involved in the treatment plan. Berniece Andreas, MD

## 2014-02-05 NOTE — ED Notes (Signed)
Patient up this morning, endorsed a good night sleep. Although he looked sad and worried. Writer notified patient that [facility]. He [facility]. Writer explained the difference to patient. He seemed worried about his DUI case pending. Q 15 minute check continues pending the time [facility] is [facility].
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
## 2015-05-25 ENCOUNTER — Emergency Department (HOSPITAL_COMMUNITY): Payer: Self-pay

## 2015-05-25 ENCOUNTER — Encounter (HOSPITAL_COMMUNITY): Payer: Self-pay | Admitting: *Deleted

## 2015-05-25 ENCOUNTER — Emergency Department (HOSPITAL_COMMUNITY)
Admission: EM | Admit: 2015-05-25 | Discharge: 2015-05-25 | Disposition: A | Discharge status: Home / Self Care | Payer: Self-pay | Attending: Emergency Medicine | Admitting: Emergency Medicine

## 2015-05-25 DIAGNOSIS — S24109A Unspecified injury at unspecified level of thoracic spinal cord, initial encounter: Secondary | ICD-10-CM | POA: Insufficient documentation

## 2015-05-25 DIAGNOSIS — Y92410 Unspecified street and highway as the place of occurrence of the external cause: Secondary | ICD-10-CM | POA: Insufficient documentation

## 2015-05-25 DIAGNOSIS — S2239XA Fracture of one rib, unspecified side, initial encounter for closed fracture: Secondary | ICD-10-CM

## 2015-05-25 DIAGNOSIS — S4992XA Unspecified injury of left shoulder and upper arm, initial encounter: Secondary | ICD-10-CM | POA: Insufficient documentation

## 2015-05-25 DIAGNOSIS — Y998 Other external cause status: Secondary | ICD-10-CM | POA: Insufficient documentation

## 2015-05-25 DIAGNOSIS — Y9389 Activity, other specified: Secondary | ICD-10-CM | POA: Insufficient documentation

## 2015-05-25 DIAGNOSIS — S2243XA Multiple fractures of ribs, bilateral, initial encounter for closed fracture: Secondary | ICD-10-CM | POA: Insufficient documentation

## 2015-05-25 LAB — I-STAT CHEM 8, ED
BUN: 15 mg/dL (ref 6–20)
Calcium, Ion: 1.13 mmol/L (ref 1.12–1.23)
Chloride: 106 mmol/L (ref 101–111)
Creatinine, Ser: 1 mg/dL (ref 0.61–1.24)
GLUCOSE: 108 mg/dL — AB (ref 65–99)
HEMATOCRIT: 42 % (ref 39.0–52.0)
Hemoglobin: 14.3 g/dL (ref 13.0–17.0)
Potassium: 4.1 mmol/L (ref 3.5–5.1)
Sodium: 139 mmol/L (ref 135–145)
TCO2: 21 mmol/L (ref 0–100)

## 2015-05-25 LAB — CBC
HCT: 39.3 % (ref 39.0–52.0)
Hemoglobin: 13 g/dL (ref 13.0–17.0)
MCH: 28 pg (ref 26.0–34.0)
MCHC: 33.1 g/dL (ref 30.0–36.0)
MCV: 84.5 fL (ref 78.0–100.0)
PLATELETS: 257 10*3/uL (ref 150–400)
RBC: 4.65 MIL/uL (ref 4.22–5.81)
RDW: 13.7 % (ref 11.5–15.5)
WBC: 8.3 10*3/uL (ref 4.0–10.5)

## 2015-05-25 LAB — BASIC METABOLIC PANEL
Anion gap: 10 (ref 5–15)
BUN: 15 mg/dL (ref 6–20)
CO2: 24 mmol/L (ref 22–32)
Calcium: 8.9 mg/dL (ref 8.9–10.3)
Chloride: 106 mmol/L (ref 101–111)
Creatinine, Ser: 1.07 mg/dL (ref 0.61–1.24)
GFR calc Af Amer: >60 mL/min (ref >60)
GFR calc non Af Amer: >60 mL/min (ref >60)
Glucose, Bld: 107 mg/dL — ABNORMAL HIGH (ref 65–99)
POTASSIUM: 4.2 mmol/L (ref 3.5–5.1)
SODIUM: 140 mmol/L (ref 135–145)

## 2015-05-25 MED ORDER — OXYCODONE-ACETAMINOPHEN 5-325 MG PO TABS
2.0000 | ORAL_TABLET | Freq: Once | ORAL | Status: AC
  Administered 2015-05-25: 2 via ORAL
  Filled 2015-05-25: qty 2

## 2015-05-25 MED ORDER — HYDROMORPHONE HCL 1 MG/ML IJ SOLN
1.0000 mg | Freq: Once | INTRAMUSCULAR | Status: AC
  Administered 2015-05-25: 1 mg via INTRAVENOUS
  Filled 2015-05-25: qty 1

## 2015-05-25 MED ORDER — IBUPROFEN 600 MG PO TABS: 600.0000 mg | ORAL_TABLET | Freq: Three times a day (TID) | ORAL | Status: AC | PRN

## 2015-05-25 MED ORDER — IOHEXOL 300 MG/ML  SOLN
100.0000 mL | Freq: Once | INTRAMUSCULAR | Status: AC | PRN
  Administered 2015-05-25: 100 mL via INTRAVENOUS

## 2015-05-25 MED ORDER — OXYCODONE-ACETAMINOPHEN 5-325 MG PO TABS: 1.0000 | ORAL_TABLET | ORAL | Status: AC | PRN
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
## 2015-05-25 NOTE — ED Notes (Addendum)
Pt still in XR, will start an PIV and administer pain medication as ordered upon his return.

## 2015-05-25 NOTE — ED Notes (Signed)
Patient transported to CT 

## 2015-05-25 NOTE — ED Notes (Signed)
Patient transported to X-ray 
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
## 2020-08-29 ENCOUNTER — Ambulatory Visit: Payer: Self-pay

## 2023-06-16 ENCOUNTER — Emergency Department (HOSPITAL_COMMUNITY)
Admission: EM | Admit: 2023-06-16 | Discharge: 2023-06-16 | Disposition: A | Discharge status: Home / Self Care | Payer: Medicare HMO | Attending: Emergency Medicine | Admitting: Emergency Medicine

## 2023-06-16 ENCOUNTER — Other Ambulatory Visit: Payer: Self-pay

## 2023-06-16 ENCOUNTER — Emergency Department (HOSPITAL_COMMUNITY): Payer: Medicare HMO

## 2023-06-16 DIAGNOSIS — M7989 Other specified soft tissue disorders: Secondary | ICD-10-CM | POA: Diagnosis not present

## 2023-06-16 DIAGNOSIS — M25531 Pain in right wrist: Secondary | ICD-10-CM | POA: Diagnosis not present

## 2023-06-16 LAB — BASIC METABOLIC PANEL
Anion gap: 13 (ref 5–15)
BUN: 16 mg/dL (ref 8–23)
CO2: 23 mmol/L (ref 22–32)
Calcium: 8.6 mg/dL — ABNORMAL LOW (ref 8.9–10.3)
Chloride: 99 mmol/L (ref 98–111)
Creatinine, Ser: 1.19 mg/dL (ref 0.61–1.24)
GFR, Estimated: >60 mL/min (ref >60)
Glucose, Bld: 93 mg/dL (ref 70–99)
Potassium: 4.6 mmol/L (ref 3.5–5.1)
Sodium: 135 mmol/L (ref 135–145)

## 2023-06-16 LAB — CBC WITH DIFFERENTIAL/PLATELET
Abs Immature Granulocytes: 0.04 10*3/uL (ref 0.00–0.07)
Basophils Absolute: 0 10*3/uL (ref 0.0–0.1)
Basophils Relative: 0 %
Eosinophils Absolute: 0.2 10*3/uL (ref 0.0–0.5)
Eosinophils Relative: 3 %
HCT: 35.9 % — ABNORMAL LOW (ref 39.0–52.0)
Hemoglobin: 12 g/dL — ABNORMAL LOW (ref 13.0–17.0)
Immature Granulocytes: 1 %
Lymphocytes Relative: 9 %
Lymphs Abs: 0.7 10*3/uL (ref 0.7–4.0)
MCH: 28.1 pg (ref 26.0–34.0)
MCHC: 33.4 g/dL (ref 30.0–36.0)
MCV: 84.1 fL (ref 80.0–100.0)
Monocytes Absolute: 0.8 10*3/uL (ref 0.1–1.0)
Monocytes Relative: 10 %
Neutro Abs: 6.2 10*3/uL (ref 1.7–7.7)
Neutrophils Relative %: 77 %
Platelets: 233 10*3/uL (ref 150–400)
RBC: 4.27 MIL/uL (ref 4.22–5.81)
RDW: 13.6 % (ref 11.5–15.5)
WBC: 8 10*3/uL (ref 4.0–10.5)
nRBC: 0 % (ref 0.0–0.2)

## 2023-06-16 MED ORDER — COLCHICINE 0.6 MG PO TABS: 0.6000 mg | ORAL_TABLET | Freq: Every day | ORAL | 0 refills | Status: AC | PRN

## 2023-06-16 MED ORDER — ALLOPURINOL 100 MG PO TABS: 100.0000 mg | ORAL_TABLET | Freq: Every day | ORAL | 1 refills | Status: AC

## 2023-06-16 MED ORDER — COLCHICINE 0.6 MG PO TABS
0.6000 mg | ORAL_TABLET | Freq: Once | ORAL | Status: AC
  Administered 2023-06-16: 0.6 mg via ORAL
  Filled 2023-06-16: qty 1

## 2023-06-16 MED ORDER — OXYCODONE-ACETAMINOPHEN 5-325 MG PO TABS
2.0000 | ORAL_TABLET | Freq: Once | ORAL | Status: AC
  Administered 2023-06-16: 2 via ORAL
  Filled 2023-06-16: qty 2
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
## 2023-06-16 NOTE — ED Provider Notes (Signed)
[place] EMERGENCY DEPARTMENT AT [facility] Provider Note   CSN: [id number] Arrival date & time: 06/16/23  4098     History  Chief Complaint  Patient presents with   Wrist Pain    Robert Johns is a 67 y.o. male.  H/o Gout? Had colchicine and allopurinol. No recurrence but a couple days ago it recurred and has been getting more swollen and red like last time but came in before it got too bad. On review of the records it appears it was 2014 when this visit occurred and his uric acid was hight at that time. Other labs were ok. Patient tried taking a colchicine that was from that visit and it might have helped some.  Wonders if pulling on a shopping cart 5 days ago caused it. Also wonders if holding his phone in his hand for a few hours could've caused it. No falls. No fevers. No recent illnesses. No systemic symptoms.    Wrist Pain       Home Medications Prior to Admission medications   Medication Sig Start Date End Date Taking? Authorizing Provider  allopurinol (ZYLOPRIM) 100 MG tablet Take 1 tablet (100 mg total) by mouth daily. After this flare resolves 06/16/23  Yes Tyrianna Lightle, Barbara Cower, MD  colchicine 0.6 MG tablet Take 1 tablet (0.6 mg total) by mouth daily as needed. 06/16/23  Yes Yong Wahlquist, Barbara Cower, MD  ibuprofen (ADVIL,MOTRIN) 600 MG tablet Take 1 tablet (600 mg total) by mouth every 8 (eight) hours as needed. 05/25/15   Azalia Bilis, MD  oxyCODONE-acetaminophen (PERCOCET/ROXICET) 5-325 MG per tablet Take 1 tablet by mouth every 4 (four) hours as needed for severe pain. 05/25/15   Azalia Bilis, MD      Allergies    Patient has no known allergies.    Review of Systems   Review of Systems  Physical Exam Updated Vital Signs BP (!) 153/98   Pulse 70   Temp 98.6 F (37 C)   Resp 19   Ht 5\' 7"  (1.702 m)   Wt 95.3 kg   SpO2 100%   BMI 32.89 kg/m  Physical Exam Vitals and nursing note reviewed.  Constitutional:      Appearance: He is well-developed.  HENT:      Head: Normocephalic and atraumatic.  Cardiovascular:     Rate and Rhythm: Normal rate.  Pulmonary:     Effort: Pulmonary effort is normal. No respiratory distress.  Abdominal:     General: There is no distension.  Musculoskeletal:        General: Normal range of motion.     Cervical back: Normal range of motion.     Comments: No obvious effusion on exam. No pain with passive ROM. No deformities. No streaking.   Skin:    General: Skin is warm and dry.     Findings: Erythema (and warmth around right wrist) present.  Neurological:     Mental Status: He is alert.     ED Results / Procedures / Treatments   Labs (all labs ordered are listed, but only abnormal results are displayed) Labs Reviewed  CBC WITH DIFFERENTIAL/PLATELET - Abnormal; Notable for the following components:      Result Value   Hemoglobin 12.0 (*)    HCT 35.9 (*)    All other components within normal limits  BASIC METABOLIC PANEL - Abnormal; Notable for the following components:   Calcium 8.6 (*)    All other components within normal limits    EKG None  Radiology DG Wrist Complete Right  Result Date: 06/16/2023 CLINICAL DATA:  Wrist pain and swelling. EXAM: RIGHT WRIST - COMPLETE 3+ VIEW COMPARISON:  None Available. FINDINGS: No evidence for an acute fracture. No subluxation or dislocation. Degenerative changes are noted in the radiocarpal joint. Trace degenerative spurring noted in the first carpometacarpal joint. No worrisome lytic or sclerotic osseous abnormality. IMPRESSION: Degenerative changes in the radiocarpal joint and first carpometacarpal joint. No acute bony findings. Electronically Signed   By: Kennith Center M.D.   On: 06/16/2023 06:17    Procedures Procedures    Medications Ordered in ED Medications  oxyCODONE-acetaminophen (PERCOCET/ROXICET) 5-325 MG per tablet 2 tablet (2 tablets Oral Given 06/16/23 0537)  colchicine tablet 0.6 mg (0.6 mg Oral Given 06/16/23 2130)    ED Course/ Medical  Decision Making/ A&P                             Medical Decision Making Amount and/or Complexity of Data Reviewed Labs: ordered. Radiology: ordered.  Risk Prescription drug management.  Likely recurrent gout. Less likely cellulitis. Unlikely septic arthritis without effusion, fever or pain with ROM. doubt DVT with the relatively localized erythema and low risk. Will check xr and kidney function and likely treat for gout with pcp follow up. Will advise starting back on allopurinol after current attack resolves.  Kidney function ok, will start colchicine.   Final Clinical Impression(s) / ED Diagnoses Final diagnoses:  Right wrist pain    Rx / DC Orders ED Discharge Orders          Ordered    colchicine 0.6 MG tablet  Daily PRN        06/16/23 0648    allopurinol (ZYLOPRIM) 100 MG tablet  Daily        06/16/23 0648              Ethal Gotay, Barbara Cower, MD 06/16/23 [phone]

## 2023-06-16 NOTE — ED Notes (Signed)
Pt provided with AVS.  Education complete; all questions answered.  Pt leaving ED in stable condition at this time, ambulatory with all belongings. 

## 2023-06-23 ENCOUNTER — Telehealth: Payer: Self-pay | Admitting: *Deleted

## 2023-06-23 NOTE — Telephone Encounter (Signed)
Transition Care Management Unsuccessful Follow-up Telephone Call  Date of discharge and from where:  [facility] 06/15/2023  Attempts:  1st Attempt  Reason for unsuccessful TCM follow-up call:  No answer/busy

## 2023-06-24 ENCOUNTER — Telehealth: Payer: Self-pay | Admitting: *Deleted

## 2023-06-24 NOTE — Telephone Encounter (Signed)
Transition Care Management Unsuccessful Follow-up Telephone Call  Date of discharge and from where:  [facility] 06/15/2023  Attempts:  2nd Attempt  Reason for unsuccessful TCM follow-up call:  Left voice message

## 2024-01-19 ENCOUNTER — Other Ambulatory Visit: Payer: Self-pay

## 2024-01-19 ENCOUNTER — Emergency Department (HOSPITAL_COMMUNITY)
Admission: EM | Admit: 2024-01-19 | Discharge: 2024-01-19 | Disposition: A | Discharge status: Home / Self Care | Payer: 59 | Attending: Emergency Medicine | Admitting: Emergency Medicine

## 2024-01-19 ENCOUNTER — Emergency Department (HOSPITAL_COMMUNITY): Payer: 59

## 2024-01-19 DIAGNOSIS — M10041 Idiopathic gout, right hand: Secondary | ICD-10-CM | POA: Diagnosis not present

## 2024-01-19 DIAGNOSIS — M109 Gout, unspecified: Secondary | ICD-10-CM | POA: Insufficient documentation

## 2024-01-19 DIAGNOSIS — M25572 Pain in left ankle and joints of left foot: Secondary | ICD-10-CM | POA: Diagnosis present

## 2024-01-19 DIAGNOSIS — M19041 Primary osteoarthritis, right hand: Secondary | ICD-10-CM | POA: Diagnosis not present

## 2024-01-19 DIAGNOSIS — M79641 Pain in right hand: Secondary | ICD-10-CM | POA: Diagnosis not present

## 2024-01-19 DIAGNOSIS — M1811 Unilateral primary osteoarthritis of first carpometacarpal joint, right hand: Secondary | ICD-10-CM | POA: Diagnosis not present

## 2024-01-19 DIAGNOSIS — M7989 Other specified soft tissue disorders: Secondary | ICD-10-CM | POA: Diagnosis not present

## 2024-01-19 DIAGNOSIS — Z8739 Personal history of other diseases of the musculoskeletal system and connective tissue: Secondary | ICD-10-CM | POA: Diagnosis not present

## 2024-01-19 LAB — BASIC METABOLIC PANEL
Anion gap: 8 (ref 5–15)
BUN: 14 mg/dL (ref 8–23)
CO2: 22 mmol/L (ref 22–32)
Calcium: 8.7 mg/dL — ABNORMAL LOW (ref 8.9–10.3)
Chloride: 107 mmol/L (ref 98–111)
Creatinine, Ser: 0.94 mg/dL (ref 0.61–1.24)
GFR, Estimated: >60 mL/min (ref >60)
Glucose, Bld: 88 mg/dL (ref 70–99)
Potassium: 4.1 mmol/L (ref 3.5–5.1)
Sodium: 137 mmol/L (ref 135–145)

## 2024-01-19 LAB — CBC WITH DIFFERENTIAL/PLATELET
Abs Immature Granulocytes: 0.03 10*3/uL (ref 0.00–0.07)
Basophils Absolute: 0 10*3/uL (ref 0.0–0.1)
Basophils Relative: 0 %
Eosinophils Absolute: 0.2 10*3/uL (ref 0.0–0.5)
Eosinophils Relative: 3 %
HCT: 37.4 % — ABNORMAL LOW (ref 39.0–52.0)
Hemoglobin: 12.5 g/dL — ABNORMAL LOW (ref 13.0–17.0)
Immature Granulocytes: 0 %
Lymphocytes Relative: 9 %
Lymphs Abs: 0.8 10*3/uL (ref 0.7–4.0)
MCH: 27.6 pg (ref 26.0–34.0)
MCHC: 33.4 g/dL (ref 30.0–36.0)
MCV: 82.6 fL (ref 80.0–100.0)
Monocytes Absolute: 0.7 10*3/uL (ref 0.1–1.0)
Monocytes Relative: 9 %
Neutro Abs: 6.8 10*3/uL (ref 1.7–7.7)
Neutrophils Relative %: 79 %
Platelets: 224 10*3/uL (ref 150–400)
RBC: 4.53 MIL/uL (ref 4.22–5.81)
RDW: 13.7 % (ref 11.5–15.5)
WBC: 8.5 10*3/uL (ref 4.0–10.5)
nRBC: 0 % (ref 0.0–0.2)

## 2024-01-19 MED ORDER — PREDNISONE 20 MG PO TABS
40.0000 mg | ORAL_TABLET | Freq: Once | ORAL | Status: AC
  Administered 2024-01-19: 40 mg via ORAL
  Filled 2024-01-19: qty 2

## 2024-01-19 MED ORDER — COLCHICINE 0.6 MG PO TABS: 0.6000 mg | ORAL_TABLET | Freq: Every day | ORAL | 0 refills | Status: AC

## 2024-01-19 MED ORDER — PREDNISONE 10 MG PO TABS: 40.0000 mg | ORAL_TABLET | Freq: Every day | ORAL | 0 refills | Status: AC

## 2024-01-19 MED ORDER — COLCHICINE 0.6 MG PO TABS
0.6000 mg | ORAL_TABLET | Freq: Once | ORAL | Status: AC
  Administered 2024-01-19: 0.6 mg via ORAL
  Filled 2024-01-19: qty 1

## 2024-01-19 NOTE — Discharge Instructions (Signed)
Return for any problem.  ?

## 2024-01-19 NOTE — ED Triage Notes (Signed)
Patient c/o Ankle and hand swelling x 2 weeks. Patient report worsening swelling today. Patient report run out gout medication 1-2 months ago. Patient denies N/V. Patient denies SOB.q

## 2024-01-19 NOTE — ED Provider Notes (Signed)
[place] EMERGENCY DEPARTMENT AT [facility] Provider Note   CSN: [id number] Arrival date & time: 01/19/24  1624     History  Chief Complaint  Patient presents with   Joint Swelling   Hand Swelling    Robert Johns is a 68 y.o. male.  68 year old male with prior medical history as detailed below presents for evaluation.  Patient with history of gout.  Patient reports increased pain to his left ankle and right hand.  This is consistent with prior episodes of gout.  Patient reports that he is out of his colchicine.  Typically he would have taken colchicine given symptoms consistent with likely gout flare.  He denies injury.  Denies fever.  He denies other complaint.  The history is provided by the patient and medical records.       Home Medications Prior to Admission medications   Medication Sig Start Date End Date Taking? Authorizing Provider  allopurinol (ZYLOPRIM) 100 MG tablet Take 1 tablet (100 mg total) by mouth daily. After this flare resolves 06/16/23   Mesner, Barbara Cower, MD  colchicine 0.6 MG tablet Take 1 tablet (0.6 mg total) by mouth daily as needed. 06/16/23   Mesner, Barbara Cower, MD  ibuprofen (ADVIL,MOTRIN) 600 MG tablet Take 1 tablet (600 mg total) by mouth every 8 (eight) hours as needed. 05/25/15   Azalia Bilis, MD  oxyCODONE-acetaminophen (PERCOCET/ROXICET) 5-325 MG per tablet Take 1 tablet by mouth every 4 (four) hours as needed for severe pain. 05/25/15   Azalia Bilis, MD      Allergies    Patient has no known allergies.    Review of Systems   Review of Systems  All other systems reviewed and are negative.   Physical Exam Updated Vital Signs BP (!) 163/106   Pulse 89   Temp 98 F (36.7 C)   Resp 16   Ht 5\' 7"  (1.702 m)   Wt 95 kg   SpO2 99%   BMI 32.80 kg/m  Physical Exam Vitals and nursing note reviewed.  Constitutional:      General: He is not in acute distress.    Appearance: Normal appearance. He is well-developed.  HENT:      Head: Normocephalic and atraumatic.  Eyes:     Conjunctiva/sclera: Conjunctivae normal.     Pupils: Pupils are equal, round, and reactive to light.  Cardiovascular:     Rate and Rhythm: Normal rate and regular rhythm.     Heart sounds: Normal heart sounds.  Pulmonary:     Effort: Pulmonary effort is normal. No respiratory distress.     Breath sounds: Normal breath sounds.  Abdominal:     General: There is no distension.     Palpations: Abdomen is soft.     Tenderness: There is no abdominal tenderness.  Musculoskeletal:        General: Tenderness present. No deformity. Normal range of motion.     Cervical back: Normal range of motion and neck supple.     Comments: Mild edema and tenderness overlying the base of the right thumb.  Mild edema and tenderness overlying the dorsum of the left foot and left anterior ankle.  Skin:    General: Skin is warm and dry.  Neurological:     General: No focal deficit present.     Mental Status: He is alert and oriented to person, place, and time.     ED Results / Procedures / Treatments   Labs (all labs ordered are listed, but only  abnormal results are displayed) Labs Reviewed  BASIC METABOLIC PANEL  CBC WITH DIFFERENTIAL/PLATELET    EKG None  Radiology No results found.  Procedures Procedures    Medications Ordered in ED Medications  colchicine tablet 0.6 mg (has no administration in time range)    ED Course/ Medical Decision Making/ A&P                                 Medical Decision Making Amount and/or Complexity of Data Reviewed Labs: ordered. Radiology: ordered.  Risk Prescription drug management.    Medical Screen Complete  This patient presented to the ED with complaint of right hand pain, left ankle pain, gout flare.  This complaint involves an extensive number of treatment options. The initial differential diagnosis includes, but is not limited to, likely gout exacerbation, metabolic abnormality,  etc.  This presentation is: Acute, Chronic, Self-Limited, Previously Undiagnosed, Uncertain Prognosis, Complicated, and Systemic Symptoms  Patient with known history of gout presents with symptoms consistent with likely exacerbation of gout.  Patient without colchicine or other medications at home to manage his gout flare.  Screening labs obtained are without significant abnormality.  Patient provided with both a course of colchicine and also prednisone.  Patient reports he does have allopurinol at home.  He is advised to start taking the allopurinol after resolution of his gout flare.  Importance of close follow-up with PCP as is stressed.  Strict return precautions given and understood.    Additional history obtained:  External records from outside sources obtained and reviewed including prior ED visits and prior Inpatient records.    Lab Tests:  I ordered and personally interpreted labs.  The pertinent results include: CBC, BMP   Imaging Studies ordered:  I ordered imaging studies including plain films of the right hand and left foot I independently visualized and interpreted obtained imaging which showed NAD I agree with the radiologist interpretation.   Medicines ordered:  I ordered medication including colchicine, prednisone for gout exacerbation Reevaluation of the patient after these medicines showed that the patient: improved  Problem List / ED Course:  Gout exacerbation   Reevaluation:  After the interventions noted above, I reevaluated the patient and found that they have: improved   Disposition:  After consideration of the diagnostic results and the patients response to treatment, I feel that the patent would benefit from close outpatient follow-up.          Final Clinical Impression(s) / ED Diagnoses Final diagnoses:  Acute gout, unspecified cause, unspecified site    Rx / DC Orders ED Discharge Orders          Ordered    colchicine 0.6  MG tablet  Daily        01/19/24 1938    predniSONE (DELTASONE) 10 MG tablet  Daily        01/19/24 1938              Wynetta Fines, MD 01/19/24 2201

## 2024-01-19 NOTE — ED Notes (Signed)
Pt ambulated to the bathroom.  

## 2024-10-28 ENCOUNTER — Telehealth: Payer: Self-pay

## 2024-10-28 NOTE — Telephone Encounter (Signed)
 Patients insurance lists us  as PCP. He needs to be established or we need to know he is getting care somewhere else. LMOM for patient to return call.

## 2024-11-11 ENCOUNTER — Telehealth: Payer: Self-pay

## 2024-11-11 NOTE — Telephone Encounter (Signed)
 Patients insurance lists us  as PCP. He needs to be established or we need to know he is getting care somewhere else. LMOM for patient to return call.
